# Patient Record
Sex: Male | Born: 1935 | Race: White | Marital: Married | Smoking: Never smoker
Health system: Northeastern US, Academic
[De-identification: ages and names within clinical notes are randomized; demographics above are authoritative.]

## PROBLEM LIST (undated history)

## (undated) DIAGNOSIS — I82409 Acute embolism and thrombosis of unspecified deep veins of unspecified lower extremity: Secondary | ICD-10-CM

## (undated) DIAGNOSIS — D6851 Activated protein C resistance: Secondary | ICD-10-CM

---

## 2011-11-18 DIAGNOSIS — A0472 Enterocolitis due to Clostridium difficile, not specified as recurrent: Secondary | ICD-10-CM

## 2011-11-18 HISTORY — DX: Enterocolitis due to Clostridium difficile, not specified as recurrent: A04.72

## 2012-05-08 ENCOUNTER — Encounter: Payer: Self-pay | Admitting: Family Medicine

## 2012-05-08 ENCOUNTER — Emergency Department
Admission: EM | Admit: 2012-05-08 | Disposition: A | Discharge status: Home / Self Care | Payer: Self-pay | Source: Ambulatory Visit | Attending: Emergency Medicine | Admitting: Emergency Medicine

## 2012-05-08 HISTORY — DX: Acute embolism and thrombosis of unspecified deep veins of unspecified lower extremity: I82.409

## 2012-05-08 HISTORY — DX: Activated protein C resistance: D68.51

## 2012-05-08 LAB — CBC AND DIFFERENTIAL
Baso # K/uL: 0 10*3/uL (ref 0.0–0.1)
Basophil %: 0.5 % (ref 0.2–1.2)
Eos # K/uL: 0.1 10*3/uL (ref 0.0–0.5)
Eosinophil %: 1.2 % (ref 0.8–7.0)
Hematocrit: 47 % (ref 40–51)
Hemoglobin: 15.8 g/dL (ref 13.7–17.5)
Lymph # K/uL: 2.1 10*3/uL (ref 1.3–3.6)
Lymphocyte %: 37.7 % (ref 21.8–53.1)
MCV: 94 fL — ABNORMAL HIGH (ref 79–92)
Mono # K/uL: 0.4 10*3/uL (ref 0.3–0.8)
Monocyte %: 7.3 % (ref 5.3–12.2)
Neut # K/uL: 3 10*3/uL (ref 1.8–5.4)
Platelets: 133 10*3/uL — ABNORMAL LOW (ref 150–330)
RBC: 5 MIL/uL (ref 4.6–6.1)
RDW: 12.6 % (ref 11.6–14.4)
Seg Neut %: 53.3 % (ref 34.0–67.9)
WBC: 5.7 10*3/uL (ref 4.2–9.1)

## 2012-05-08 LAB — BASIC METABOLIC PANEL
Anion Gap: 10 (ref 7–16)
CO2: 27 mmol/L (ref 20–28)
Calcium: 9.1 mg/dL (ref 8.6–10.2)
Chloride: 102 mmol/L (ref 96–108)
Creatinine: 0.76 mg/dL (ref 0.67–1.17)
GFR,Black: 103 *
GFR,Caucasian: 89 *
Glucose: 162 mg/dL — ABNORMAL HIGH (ref 60–99)
Lab: 13 mg/dL (ref 6–20)
Potassium: 4.1 mmol/L (ref 3.3–5.1)
Sodium: 139 mmol/L (ref 133–145)

## 2012-05-08 LAB — PROTIME-INR
INR: 1.1 (ref 1.0–1.2)
Protime: 10.8 s (ref 9.2–12.3)

## 2012-05-08 LAB — APTT: aPTT: 30 s (ref 25.8–37.9)

## 2012-05-08 MED ORDER — ENOXAPARIN SODIUM 120 MG/0.8ML IJ SOSY *I*
1.5000 mg/kg | PREFILLED_SYRINGE | Freq: Every day | INTRAMUSCULAR | Status: AC
Start: 2012-05-08 — End: 2012-05-15

## 2012-05-08 MED ORDER — ENOXAPARIN SODIUM 120 MG/0.8ML IJ SOSY *I*
1.5000 mg/kg | PREFILLED_SYRINGE | Freq: Once | INTRAMUSCULAR | Status: AC
Start: 2012-05-08 — End: 2012-05-08
  Administered 2012-05-08: 120 mg via SUBCUTANEOUS
  Filled 2012-05-08: qty 0.8

## 2012-05-08 MED ORDER — WARFARIN SODIUM 5 MG PO TABS *I*
5.0000 mg | ORAL_TABLET | Freq: Once | ORAL | Status: AC
Start: 2012-05-08 — End: 2012-05-08
  Administered 2012-05-08: 5 mg via ORAL
  Filled 2012-05-08: qty 1

## 2012-05-08 MED ORDER — WARFARIN SODIUM 5 MG PO TABS *I*
5.0000 mg | ORAL_TABLET | Freq: Every day | ORAL | Status: AC
Start: 2012-05-08 — End: 2012-05-15

## 2012-05-08 NOTE — ED Notes (Signed)
Pt c/o left leg pain since yesterday. Pt states he just drove @ 1200 miles from [place] and arrived yesterday. Has had a DVT in left leg in the past.

## 2012-05-08 NOTE — Discharge Instructions (Signed)
You have been diagnosed with a deep vein thrombosis.  You are being treated with anticoagulation.  Take lovenox injections daily for 7 days as instructed.  Take warfarin 5mg  daily until your INR is checked at the lab.  You have a lab requisition for for [facility] lab throught the [facility]. Get a lab draw in 4 days.    Some illnesses are best understood/treated over time.  May return day or night for significant worsening.  A call to primary care physician is recommended for any acute concerns.   Close follow up is recommended.

## 2012-05-08 NOTE — First Provider Contact (Signed)
ED Medical Screening Exam Note    Initial provider evaluation performed by   ED First Provider Contact    Date/Time Event User Comments    05/08/12 0920 ED Provider First Contact SASSANO, Furkan Keenum L Initial Face to Face Provider Contact          77 year-old male, history of factor V leiden and LLE DVT five years ago, presents with LLE pain, swelling, and redness since last night.  Just arrived home yesterday after a car trip from [place].  He is not currently anti-coagulated.  No chest pain or SOB, but note HR is 102.        LABS and U/S ordered.      Patient requires further evaluation.     Nicola Girt [place], [place], 05/08/2012, 9:33 AM

## 2012-05-08 NOTE — ED Provider Notes (Addendum)
History     Chief Complaint   Patient presents with   . Leg Pain     HPI Comments: Craig Frost is a 77 y.o. Male with pain and swelling over his left anterior tibia. Patient drove from [facility] yesterday, and began to have LLE pain last night. Aching, throbing. Pain now is throbbing, radiating down his leg, with associated burning sensation.  Denies chest pain, abd pain, SOB.        The history is provided by the patient.       Past Medical History   Diagnosis Date   . DVT (deep venous thrombosis)    . Factor V Leiden             Past Surgical History   Procedure Laterality Date   . Knee surgery         History reviewed. No pertinent family history.      Social History      reports that he has never smoked. He does not have any smokeless tobacco history on file. His alcohol, drug, and sexual activity histories not on file.    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   HENT: Negative for congestion.    Eyes: Negative for visual disturbance.   Respiratory: Negative for cough and shortness of breath.    Cardiovascular: Negative for chest pain.   Gastrointestinal: Negative for nausea and vomiting.   Musculoskeletal: Negative for back pain.   Skin: Negative for rash.   Neurological: Negative for syncope.   Psychiatric/Behavioral: Negative for confusion.       Physical Exam     ED Triage Vitals   BP Heart Rate Resp Temp Temp src SpO2 O2 Device O2 Flow Rate weight   05/08/12 0913 05/08/12 0913 05/08/12 0913 05/08/12 0913 -- 05/08/12 0913 05/08/12 0913 -- 05/08/12 0913   132/81 mmHg 102  18  36.2 C (97.2 F)  98 % None (Room air)  81.647 kg (180 lb)       Physical Exam   Nursing note and vitals reviewed.  Constitutional: He is oriented to person, place, and time. He appears well-developed.   HENT:    Head: Normocephalic and atraumatic.   Eyes: Conjunctivae and EOM are normal. Pupils are equal, round, and reactive to light.   Neck: Normal range of motion. Neck supple.   Cardiovascular: Regular rhythm, normal heart sounds and intact distal pulses.    Pulmonary/Chest: Effort normal and breath sounds normal. No respiratory distress. He has no wheezes. He has no rales. He exhibits no tenderness.   Abdominal: Soft. Bowel sounds are normal. There is no tenderness.   Musculoskeletal:        Left lower leg: He exhibits tenderness.        Legs:  TTP over medial proximal tibia, mild swelling and ecchymosis.     Lymphadenopathy:     He has no cervical adenopathy.   Neurological: He is alert and oriented to person, place, and time. No cranial nerve deficit.   Skin: Skin is warm and dry.   Psychiatric: He has a normal mood and affect.  Medical Decision Making      Amount and/or Complexity of Data Reviewed  Clinical lab tests: ordered        Initial Evaluation:  ED First Provider Contact    Date/Time Event User Comments    05/08/12 0920 ED Provider First Contact SASSANO, JENNIFER L Initial Face to Face Provider Contact          Patient seen by me as above    Assessment:  77 y.o., male comes to the ED with LLE pain,swelling, recent long car trip, Factor V leiden.  [place] c/w DVT in LLE.      Differential Diagnosis includes DVT, thrombophlebitis, low suspicion for PE.              Plan:   1. Diagnostics: cbc,ed7,inr,ptt  [place] LLE  2. Therapeutic: offer analgesia and antiemetics  3. Disposition: pending workup    +DVT  Lovenox, warfarin.  Will treat and discharge with prescriptions, and close follow up with PCP for INR.    Jaci Carrel, MD    Jaci Carrel, MD  Resident  05/08/12 1233              Resident Attestation:     Patient seen by me today, 05/08/2012 at 1145.    History:   I reviewed this patient, reviewed the resident's note and agree.    Exam:   I examined this patient, reviewed the resident's note and  agree.    Decision Making:   I discussed with the resident his/her documented decision making  and agree.      Author Jarrett Ables, MD      Jarrett Ables, MD  05/08/12 [phone]

## 2012-05-08 NOTE — ED Notes (Signed)
Introduced self to patient he is relaxing in bed awaiting test.

## 2012-05-08 NOTE — ED Notes (Signed)
Bed:AC-06L<BR> Expected date:<BR> Expected time:<BR> Means of arrival:<BR> Comments:<BR>

## 2012-05-08 NOTE — ED Notes (Addendum)
Pt cc left leg pain, swelling, redness onset yesterday after returning from [place], pt states " i drove back from [place]. " Pt has factor V leyden Pt denies cp/sob at this time. Will monitor pt.

## 2012-05-12 ENCOUNTER — Ambulatory Visit
Admit: 2012-05-12 | Discharge: 2012-05-12 | Disposition: A | Discharge status: Home / Self Care | Payer: Self-pay | Source: Ambulatory Visit | Attending: Gastroenterology | Admitting: Gastroenterology

## 2012-05-12 LAB — PROTIME-INR
INR: 1.3 — ABNORMAL HIGH (ref 1.0–1.2)
Protime: 13.4 s — ABNORMAL HIGH (ref 9.2–12.3)

## 2012-05-13 ENCOUNTER — Telehealth: Payer: Self-pay | Admitting: Internal Medicine

## 2012-05-13 NOTE — Telephone Encounter (Signed)
Patient is looking for a primary care physician, says he has MCR and BS as secondary.  Patient would like to get set up with Dr.Carroll.  Patient recently had a blood clot in his leg that he was treated for in [facility] ED.

## 2012-05-13 NOTE — Telephone Encounter (Signed)
Called patient, left vm for pt to call back and schedule appt.

## 2012-05-17 ENCOUNTER — Ambulatory Visit
Admit: 2012-05-17 | Discharge: 2012-05-17 | Disposition: A | Discharge status: Home / Self Care | Payer: Self-pay | Source: Ambulatory Visit | Attending: Gastroenterology | Admitting: Gastroenterology

## 2012-05-17 LAB — PROTIME-INR
INR: 4.4 — ABNORMAL HIGH (ref 1.0–1.2)
Protime: 48.6 s — ABNORMAL HIGH (ref 9.2–12.3)

## 2012-05-28 ENCOUNTER — Ambulatory Visit
Admit: 2012-05-28 | Discharge: 2012-05-28 | Disposition: A | Discharge status: Home / Self Care | Payer: Self-pay | Source: Ambulatory Visit | Attending: Gastroenterology | Admitting: Gastroenterology

## 2012-05-28 LAB — PROTIME-INR
INR: 3.8 — ABNORMAL HIGH (ref 1.0–1.2)
Protime: 42 s — ABNORMAL HIGH (ref 9.2–12.3)

## 2012-06-07 ENCOUNTER — Ambulatory Visit
Admit: 2012-06-07 | Discharge: 2012-06-07 | Disposition: A | Discharge status: Home / Self Care | Payer: Self-pay | Source: Ambulatory Visit

## 2012-06-07 LAB — PROTIME-INR
INR: 1.4 — ABNORMAL HIGH (ref 1.0–1.2)
Protime: 14.3 s — ABNORMAL HIGH (ref 9.2–12.3)

## 2012-06-23 ENCOUNTER — Telehealth: Payer: Self-pay | Admitting: Internal Medicine

## 2012-06-23 NOTE — Telephone Encounter (Signed)
Patient stated he has a NPV with Dr. Noralyn Pick on 8/16 and he has not received the forms in the mail yet. He would like these before his appointment so he doesn't have to fill them out at the office. Please send these to him at:  [address]   [place] [address]

## 2012-06-23 NOTE — Telephone Encounter (Signed)
His new patient packet was sent on Monday, 8/5.  I tried to reach the patient to tell him but no one answered and no voice mail.

## 2012-06-24 ENCOUNTER — Ambulatory Visit
Admit: 2012-06-24 | Discharge: 2012-06-24 | Disposition: A | Discharge status: Home / Self Care | Payer: Self-pay | Source: Ambulatory Visit

## 2012-06-24 LAB — PROTIME-INR
INR: 2.5 — ABNORMAL HIGH (ref 1.0–1.2)
Protime: 26.8 s — ABNORMAL HIGH (ref 9.2–12.3)

## 2012-07-02 ENCOUNTER — Ambulatory Visit: Payer: Self-pay | Admitting: Internal Medicine

## 2012-07-05 ENCOUNTER — Ambulatory Visit
Admit: 2012-07-05 | Discharge: 2012-07-05 | Disposition: A | Discharge status: Home / Self Care | Payer: Self-pay | Source: Ambulatory Visit

## 2012-07-05 LAB — PROTIME-INR
INR: 2.8 — ABNORMAL HIGH (ref 1.0–1.2)
Protime: 30 s — ABNORMAL HIGH (ref 9.2–12.3)

## 2012-08-09 ENCOUNTER — Ambulatory Visit
Admit: 2012-08-09 | Discharge: 2012-08-09 | Disposition: A | Discharge status: Home / Self Care | Payer: Self-pay | Source: Ambulatory Visit

## 2012-08-09 LAB — PROTIME-INR
INR: 3 — ABNORMAL HIGH (ref 1.0–1.2)
Protime: 32.6 s — ABNORMAL HIGH (ref 9.2–12.3)

## 2012-10-06 ENCOUNTER — Ambulatory Visit
Admit: 2012-10-06 | Discharge: 2012-10-06 | Disposition: A | Discharge status: Home / Self Care | Payer: Self-pay | Source: Ambulatory Visit

## 2012-10-06 LAB — PROTIME-INR
INR: 1.9 — ABNORMAL HIGH (ref 1.0–1.2)
Protime: 19.7 s — ABNORMAL HIGH (ref 9.2–12.3)

## 2014-07-05 ENCOUNTER — Ambulatory Visit
Admit: 2014-07-05 | Discharge: 2014-07-05 | Disposition: A | Discharge status: Home / Self Care | Payer: Self-pay | Source: Ambulatory Visit

## 2014-07-05 LAB — PROTIME-INR
INR: 1.1 (ref 1.0–1.2)
Protime: 12.8 s — ABNORMAL HIGH (ref 9.2–12.3)

## 2014-07-06 ENCOUNTER — Ambulatory Visit
Admit: 2014-07-06 | Discharge: 2014-07-06 | Disposition: A | Discharge status: Home / Self Care | Payer: Self-pay | Source: Ambulatory Visit

## 2014-07-06 LAB — PROTIME-INR
INR: 1.3 — ABNORMAL HIGH (ref 1.0–1.2)
Protime: 15.1 s — ABNORMAL HIGH (ref 9.2–12.3)

## 2014-07-10 ENCOUNTER — Ambulatory Visit
Admit: 2014-07-10 | Discharge: 2014-07-10 | Disposition: A | Discharge status: Home / Self Care | Payer: Self-pay | Source: Ambulatory Visit

## 2014-07-10 LAB — PROTIME-INR
INR: 2.6 — ABNORMAL HIGH (ref 1.0–1.2)
Protime: 29.7 s — ABNORMAL HIGH (ref 9.2–12.3)

## 2014-07-12 ENCOUNTER — Ambulatory Visit
Admit: 2014-07-12 | Discharge: 2014-07-12 | Disposition: A | Discharge status: Home / Self Care | Payer: Self-pay | Source: Ambulatory Visit

## 2014-07-12 LAB — PROTIME-INR
INR: 2.4 — ABNORMAL HIGH (ref 1.0–1.2)
Protime: 27.6 s — ABNORMAL HIGH (ref 9.2–12.3)

## 2014-07-17 ENCOUNTER — Ambulatory Visit
Admit: 2014-07-17 | Discharge: 2014-07-17 | Disposition: A | Discharge status: Home / Self Care | Payer: Self-pay | Source: Ambulatory Visit

## 2014-07-17 LAB — PROTIME-INR
INR: 2.4 — ABNORMAL HIGH (ref 1.0–1.2)
Protime: 27.4 s — ABNORMAL HIGH (ref 9.2–12.3)

## 2014-07-26 ENCOUNTER — Ambulatory Visit
Admit: 2014-07-26 | Discharge: 2014-07-26 | Disposition: A | Discharge status: Home / Self Care | Payer: Self-pay | Source: Ambulatory Visit

## 2014-07-26 LAB — PROTIME-INR
INR: 2.7 — ABNORMAL HIGH (ref 1.0–1.2)
Protime: 31.3 s — ABNORMAL HIGH (ref 9.2–12.3)

## 2016-07-02 ENCOUNTER — Other Ambulatory Visit
Admission: RE | Admit: 2016-07-02 | Discharge: 2016-07-02 | Disposition: A | Discharge status: Home / Self Care | Payer: Self-pay | Source: Ambulatory Visit

## 2016-07-02 LAB — BASIC METABOLIC PANEL
Anion Gap: 11 (ref 7–16)
CO2: 30 mmol/L — ABNORMAL HIGH (ref 20–28)
Calcium: 9.2 mg/dL (ref 8.6–10.2)
Chloride: 101 mmol/L (ref 96–108)
Creatinine: 0.94 mg/dL (ref 0.67–1.17)
GFR,Black: 89 *
GFR,Caucasian: 77 *
Glucose: 118 mg/dL — ABNORMAL HIGH (ref 60–99)
Lab: 14 mg/dL (ref 6–20)
Potassium: 4.3 mmol/L (ref 3.3–5.1)
Sodium: 142 mmol/L (ref 133–145)

## 2016-07-02 LAB — LIPID PANEL
Chol/HDL Ratio: 2
Cholesterol: 183 mg/dL
HDL: 92 mg/dL
LDL Calculated: 72 mg/dL
Non HDL Cholesterol: 91 mg/dL
Triglycerides: 96 mg/dL

## 2016-07-02 LAB — CBC
Hematocrit: 46 % (ref 40–51)
Hemoglobin: 15 g/dL (ref 13.7–17.5)
MCH: 31 pg/cell (ref 26–32)
MCHC: 33 g/dL (ref 32–37)
MCV: 95 fL — ABNORMAL HIGH (ref 79–92)
Platelets: 154 10*3/uL (ref 150–330)
RBC: 4.8 MIL/uL (ref 4.6–6.1)
RDW: 12.3 % (ref 11.6–14.4)
WBC: 5.8 10*3/uL (ref 4.2–9.1)

## 2016-07-02 LAB — TSH: TSH: 2.85 u[IU]/mL (ref 0.27–4.20)

## 2016-07-24 ENCOUNTER — Encounter: Payer: Self-pay | Admitting: Sports Medicine

## 2016-07-24 ENCOUNTER — Other Ambulatory Visit: Payer: Self-pay | Admitting: Sports Medicine

## 2016-07-24 ENCOUNTER — Ambulatory Visit: Payer: Self-pay | Admitting: Sports Medicine

## 2016-07-24 VITALS — BP 130/72 | Ht 72.0 in | Wt 190.0 lb

## 2016-07-24 DIAGNOSIS — M11262 Other chondrocalcinosis, left knee: Secondary | ICD-10-CM

## 2016-07-24 NOTE — Progress Notes (Signed)
C.C.: Presents for "knee pain"     HISTORY OF PRESENT ILLNESS    Craig Frost is a 81 y.o. male presents for evaluation and treatment of  left lower extremity pain. This is evaluated as a no injury. The symptoms began several weeks ago.  Mechanism of injury : none.  Patient has undergone duplex imaging of LLE which was unremarkable for DVT.  He is describing venous prominence with color changes that occur with sit to stand transfers and resolve with LLE elevation.  Has known Factor V leiden with prior history of DVT.    Current symptoms include swelling. The pain in his knee is located over his anteromedial facet and xray imaging was remarkable for OA, chondrocalcinosis.  He describes the symptoms as aching. Symptoms improve with rest, avoiding painful activities. The symptoms are worse with "activity". The knee has not given out or felt unstable. The patient can bend and straighten the knee fully.  Denies mechanical symptoms.   Patient reports history of prior problems with this knee with prior arthroscopy. No prior injections or PT.     Evaluation to date: plain films, duplex, which were documented and reviewed as below.     Listing & description of previous non surgical right knee treatment:  Activity modification: Y  Physical Therapy:N  Trial of medications:   Weight loss: N  Assisted ambulatory device: N  Intra-articular injection:N  Bracing, orthotic devices: N  Contraindications to above treatment:      Employment:Retired     Patient has previously been seen for this. Consult requested by Provider, Self Referred     Patient's medications, allergies, and surgical histories: Per patient questionnaire, reviewed and confirmed.  Past medical history: Per patient questionnaire, reviewed and confirmed.  Details include   Past Medical History:   Diagnosis Date    C. difficile colitis 2013    DVT (deep venous thrombosis)     Factor V Leiden        Social history: Per patient questionnaire, reviewed and  confirmed.   reports that she has never smoked. She does not have any smokeless tobacco history on file..  Family history: Per patient questionnaire, reviewed and confirmed.  No family history on file.  Past Surgical History:   Procedure Laterality Date    KNEE SURGERY           Evaluation to date: I personally reviewed patient's X-ray which shows chondrocalcinosis, mild/moderate knee OA.    Duplex LLE was unremarkable for DVT 07/18/16     A radiologist report is available for review.     ROS:  Review of systems:  Per patient questionnaire-"vein problems, clots".   Denies fever, chills, rash, night sweats, unintentional weight loss.  All other systems negative.         OBJECTIVE:   Vitals:    07/24/16 1345   BP: 130/72   Weight: 86.2 kg (190 lb)   Height: 1.829 m (6')       General: WDWN and no acute distress male.  Mental Status:  Alert and oriented x3,  cooperative with exam.  Extremities: prominent varicose veins L>R without LE edema; red color change noted upon standing   Gait:  Nonantalgic     Musculoskeletal:   KNEE: Trace palpable effusion or warmth. Tender to palpation: none.  Range of motion symmetric to opposite side. Neurovascularly intact.  crepitus.  Stable ligamentous testing.      Anterior Drawer: negative  Posterior Drawer: negative  Lachman's: negative  Valgus  stress: stable   Varus stress: stable  McMurray's: negative     ASSESSMENT / PLAN:  Impression:   81 y.o. M with Factor V leiden with LLE pain  that does not appear to be secondary to MSK/knee OA/chondrocalcinosis at the present time.      Plan: 1. Chondrocalcinosis of knee, left  Counseled in great length on chondrocalcinosis and OA presentation which are not c/w patient's current symptoms.  I advised follow up with vascular for further evaluation.      Questions solicited and answered.     Precautions reviewed.  Medication changes, refills reviewed including directions, side effects and monitoring.    Hoyle BarrMaria Karipidis Pouria, MD, FAAP,  CAQ  Assistant professor, Department of Orthopaedics    Primary Care-Internal Medicine/Pediatrics Sports Medicine & Orthopaedics  Non-operative Osteoarthritis/Adult Reconstruction     Faculty, [facility]    Team Physician, [facility]     [address], [address]   [place], [place] [id number]  Office Phone: [phone]  Email: [email or web address]

## 2020-05-22 ENCOUNTER — Other Ambulatory Visit: Payer: Self-pay | Admitting: Gastroenterology

## 2020-06-19 ENCOUNTER — Other Ambulatory Visit
Admission: RE | Admit: 2020-06-19 | Discharge: 2020-06-19 | Disposition: A | Discharge status: Home / Self Care | Payer: Medicare Other | Source: Ambulatory Visit

## 2020-06-19 DIAGNOSIS — Z7901 Long term (current) use of anticoagulants: Secondary | ICD-10-CM | POA: Insufficient documentation

## 2020-06-19 LAB — APTT: aPTT: 42.5 s — ABNORMAL HIGH (ref 25.8–37.9)

## 2020-06-19 LAB — PROTIME-INR
INR: 2.8 — ABNORMAL HIGH (ref 0.9–1.1)
Protime: 33.1 s — ABNORMAL HIGH (ref 10.0–12.9)

## 2020-07-27 ENCOUNTER — Other Ambulatory Visit
Admission: RE | Admit: 2020-07-27 | Discharge: 2020-07-27 | Disposition: A | Discharge status: Home / Self Care | Payer: Medicare Other | Source: Ambulatory Visit

## 2020-07-27 DIAGNOSIS — Z7901 Long term (current) use of anticoagulants: Secondary | ICD-10-CM | POA: Insufficient documentation

## 2020-07-27 LAB — PROTIME-INR
INR: 3.4 — ABNORMAL HIGH (ref 0.9–1.1)
Protime: 39.9 s — ABNORMAL HIGH (ref 10.0–12.9)

## 2020-08-15 ENCOUNTER — Other Ambulatory Visit
Admission: RE | Admit: 2020-08-15 | Discharge: 2020-08-15 | Disposition: A | Discharge status: Home / Self Care | Payer: Medicare Other | Source: Ambulatory Visit

## 2020-08-15 DIAGNOSIS — Z7901 Long term (current) use of anticoagulants: Secondary | ICD-10-CM | POA: Insufficient documentation

## 2020-08-15 LAB — APTT: aPTT: 35.6 s (ref 25.8–37.9)

## 2020-08-15 LAB — PROTIME-INR
INR: 1.7 — ABNORMAL HIGH (ref 0.9–1.1)
Protime: 19.7 s — ABNORMAL HIGH (ref 10.0–12.9)

## 2020-08-21 ENCOUNTER — Other Ambulatory Visit
Admission: RE | Admit: 2020-08-21 | Discharge: 2020-08-21 | Disposition: A | Discharge status: Home / Self Care | Payer: Medicare Other | Source: Ambulatory Visit

## 2020-08-21 DIAGNOSIS — Z7901 Long term (current) use of anticoagulants: Secondary | ICD-10-CM | POA: Insufficient documentation

## 2020-08-21 LAB — APTT: aPTT: 42.6 s — ABNORMAL HIGH (ref 25.8–37.9)

## 2020-08-21 LAB — PROTIME-INR
INR: 2.2 — ABNORMAL HIGH (ref 0.9–1.1)
Protime: 25.3 s — ABNORMAL HIGH (ref 10.0–12.9)

## 2020-11-23 IMAGING — MR MULTI PARAMETRIC MRI PROSTATE WITHOUT AND WITH CONTRAST
11 series · 48 of 48 positions shown · IV contrast (gadolinium)
Comparison: None.

MULTI PARAMETRIC MRI PROSTATE WITHOUT AND WITH CONTRAST, 11/23/2020 [DATE]: 
CLINICAL INDICATION:  Elevated PSA.
TECHNIQUE: Multiple parametric sequences were performed. 
Pre-contrast: T1 axial of the entire pelvis. T2 sagittal, axial and coronal, T1 
axial, acquired of the prostate. Diffusion with multiple B values of 0888, 1188 
calculated ADC value for mapping. 
Post contrast: Rapid sequence dynamic and axial planes through the prostate and 
seminal vesicles, T1 axial with fat sat of the entire pelvis. 3-D renderings 
were reconstructed on an independent workstation. The images were also evaluated 
with Dyna CAD computer aided detection.  A 0.5 cc gadolinium was injected 
intravenously at 3 cc/s.  As per [HOSPITAL] guidelines 3-D 
reconstructions are performed with concurrent physician supervision. The 
patient's eGFR was calculated to be 92.3 using the i-STAT device.

[Series 201: t1w_tse_ax · axial · 6.0mm · 0.44mm/px · 1 of 36 slices shown]
[im 1/36]
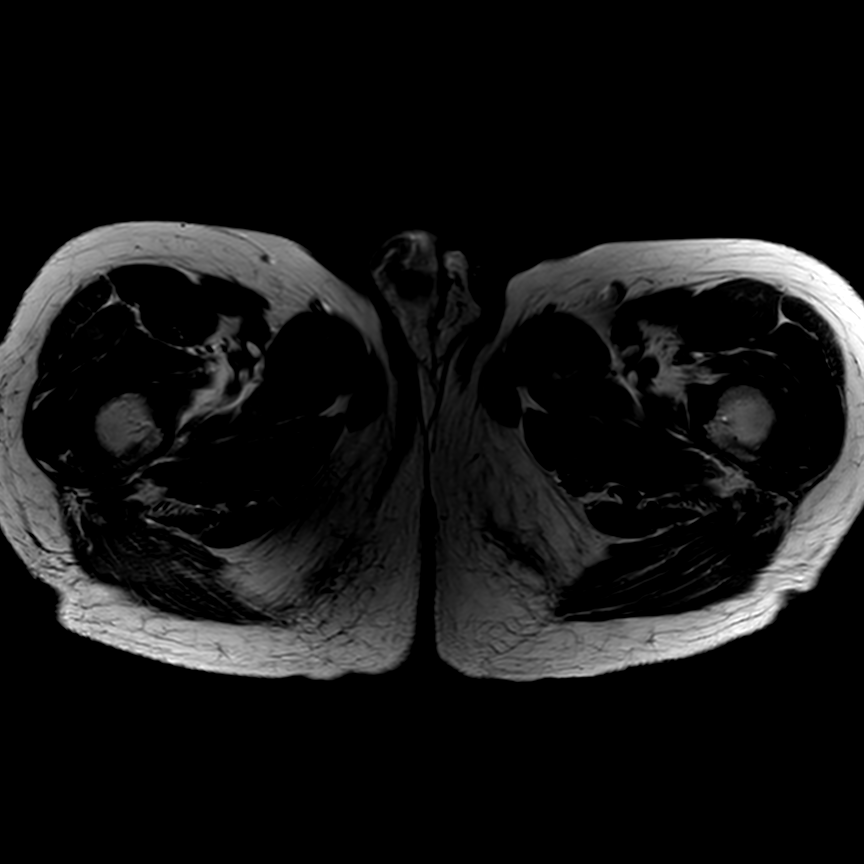

[Series 301: t2w sag · sagittal · 3.0mm · 0.45mm/px · 1 of 30 slices shown]
[im 1/30]
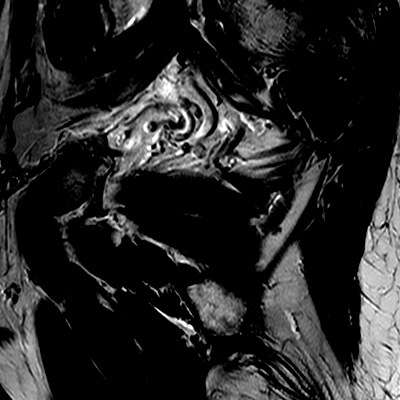

[Series 401: t2w cor · coronal · 3.0mm · 0.42mm/px · 1 of 30 slices shown]
[im 1/30]
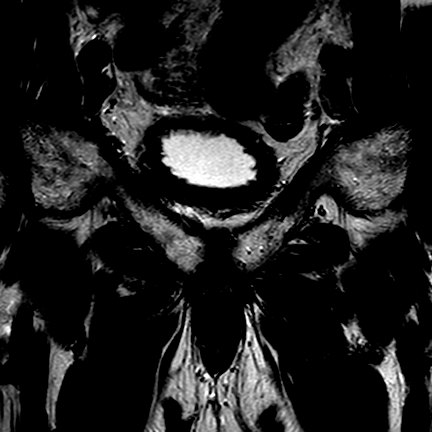

[Series 601: new-dwi_3b* 3mm* · axial · 3.0mm · 1.28mm/px · 1 of 61 slices shown]
[im 1/61]
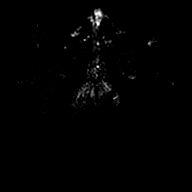

[Series 602: ADC · axial · 3.0mm · 1.28mm/px · 1 of 32 slices shown (1 of 2)]
[im 1/32]
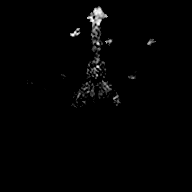

[Series 603: ADC · axial · 3.0mm · 1.28mm/px · 1 of 32 slices shown (2 of 2)]
[im 1/32]
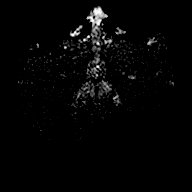

[Series 604: (id) · axial · 3.0mm · 1.28mm/px · 1 of 29 slices shown (1 of 3)]
[im 1/29]
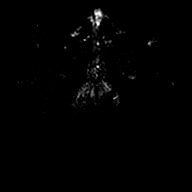

[Series 605: (id) · axial · 3.0mm · 1.28mm/px · 1 of 32 slices shown (2 of 3)]
[im 1/32]
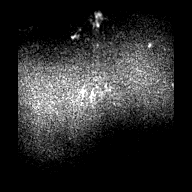

[Series 701: (id) · axial · 3.0mm · 1.28mm/px · 1 of 32 slices shown (3 of 3)]
[im 1/32]
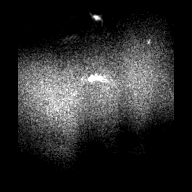

[Series 901: dyn 3mm*(ap) · axial · 3.0mm · 1.38mm/px · z∈[-154,-61]mm · 38 of 1440 slices shown]
[im 1/1440]
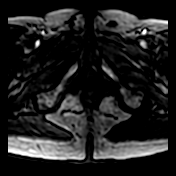
[im 39/1440]
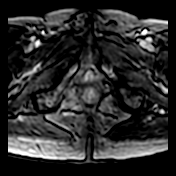
[im 78/1440]
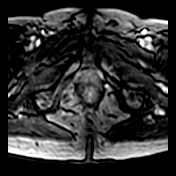
[im 117/1440]
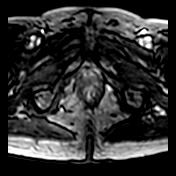
[im 156/1440]
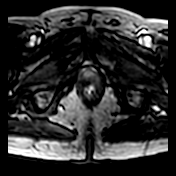
[im 195/1440]
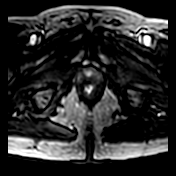
[im 234/1440]
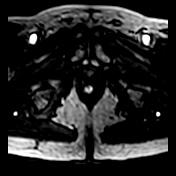
[im 273/1440]
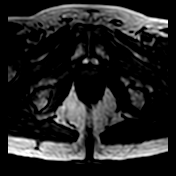
[im 312/1440]
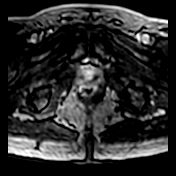
[im 351/1440]
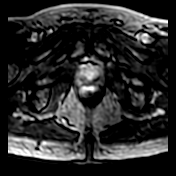
[im 389/1440]
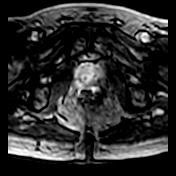
[im 428/1440]
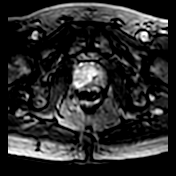
[im 467/1440]
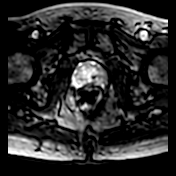
[im 506/1440]
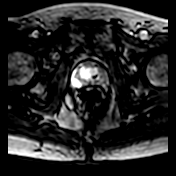
[im 545/1440]
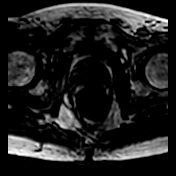
[im 584/1440]
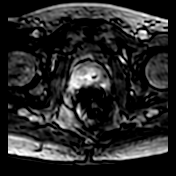
[im 623/1440]
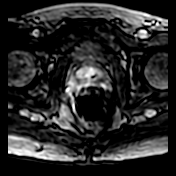
[im 662/1440]
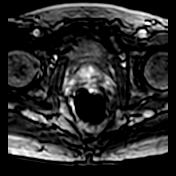
[im 701/1440]
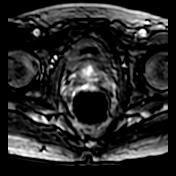
[im 739/1440]
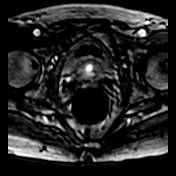
[im 778/1440]
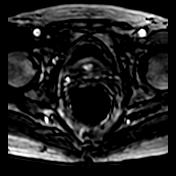
[im 817/1440]
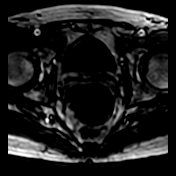
[im 856/1440]
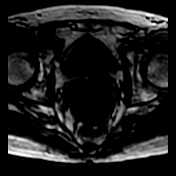
[im 895/1440]
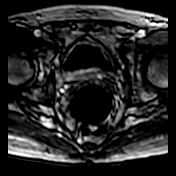
[im 934/1440]
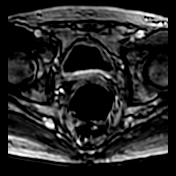
[im 973/1440]
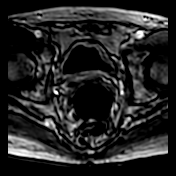
[im 1012/1440]
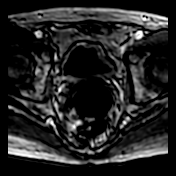
[im 1051/1440]
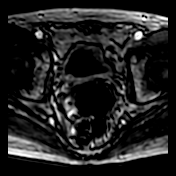
[im 1089/1440]
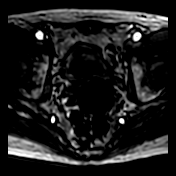
[im 1128/1440]
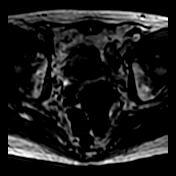
[im 1167/1440]
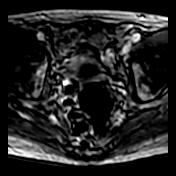
[im 1206/1440]
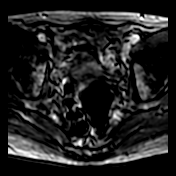
[im 1245/1440]
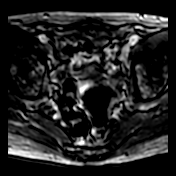
[im 1284/1440]
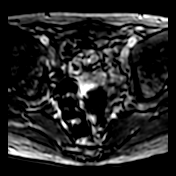
[im 1323/1440]
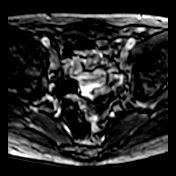
[im 1362/1440]
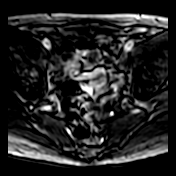
[im 1401/1440]
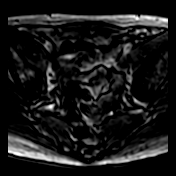
[im 1440/1440]
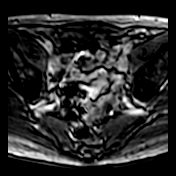

[Series 1001: t1w/sp/pel+g · axial · 6.5mm · 0.63mm/px · 1 of 42 slices shown]
[im 1/42]
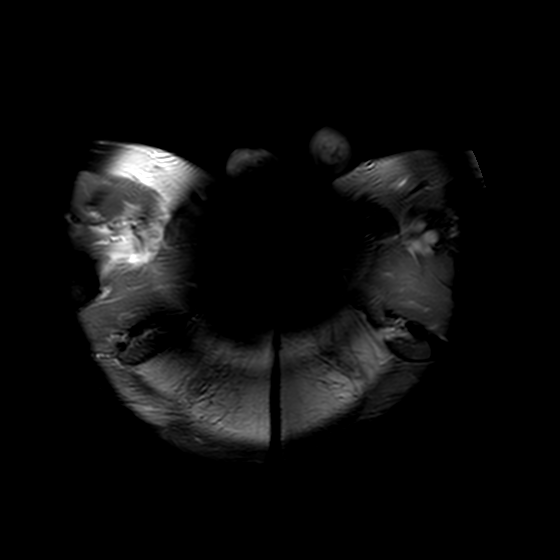

[48 of 48 positions shown; findings below may reference images not displayed]

FINDINGS: VOLUME: The prostate measures 5.1 x 3.3 x 3.4 cm with a volume of 29 cc. 
CENTRAL GLAND: There is mild hyperplasia of the central gland without suspicious 
findings. 
PERIPHERAL ZONE: There is effacement of the RIGHT posterolateral peripheral zone 
from mid gland to base by T2 hypointensity measuring upwards of 2 cm in 
dimension. This area shows kinetically suspicious asymmetric enhancement. The 
diffusion sequences are nondiagnostic due to artifact. 
PROSTATE CAPSULE: Intact and symmetric. 
MUSCLE SIDE WALLS: Normal. 
SEMINAL VESICLES: Normal. 
BLADDER: Unremarkable. 
LYMPHADENOPATHY: No suspicious lymph nodes. 
BONES: No suspicious skeletal lesion 
ADDITIONAL FINDINGS: Degenerative changes are in the lower lumbar spine.
IMPRESSION: Findings on T2 sequences are suspicious for a 2 cm focus of malignancy in the 
RIGHT posterolateral peripheral zone at mid gland extending to the base and 
there is associated asymmetric kinetically suspicious enhancement however 
because the diffusion sequences are nondiagnostic it is unknown whether there is 
restricted diffusion associated with this area or not. 
(PI-RADS 3): Intermediate ( the presence of clinically significant cancer is 
equivocal).

## 2021-04-26 IMAGING — CT CT ABDOMEN AND PELVIS WITHOUT CONTRAST
2 of 3 series · 15 of 46 positions shown, 17 images · non-contrast
Comparison: MR prostate 11/23/2020

FINAL Diagnostic Imaging Report 
________________________________________________________________________________________________ 
CT ABDOMEN AND PELVIS WITHOUT CONTRAST, 04/26/2021 [DATE]: 
CLINICAL INDICATION: Recurrent UTI, currently on antibiotics 
A search for DICOM formatted images was conducted for prior CT imaging studies 
completed at a non-affiliated media free facility.
TECHNIQUE: The abdomen and pelvis were scanned from lung bases through the 
pubic rami without contrast on a high-resolution CT scanner using dose reduction 
techniques. Routine MPR reconstructions were performed.

[Series 2: abd/pel ax wo · axial · 0.96mm/px · z∈[-539,-83]mm · 12 of 176 slices shown, 14 images]
[im 12/176  soft-tissue]
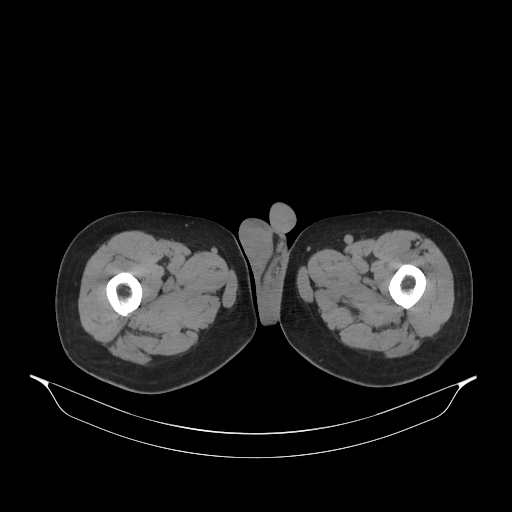
[im 12/176  bone]
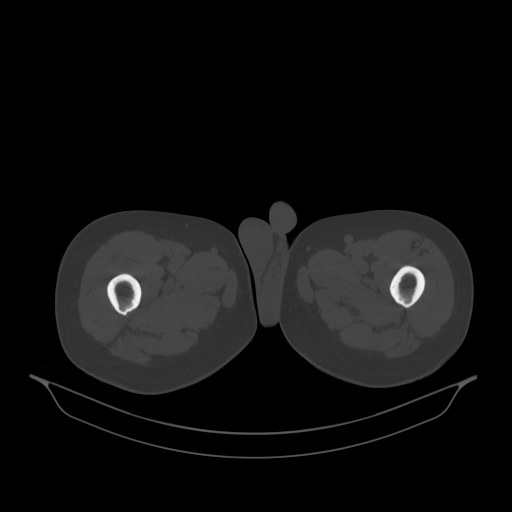
[im 23/176  soft-tissue]
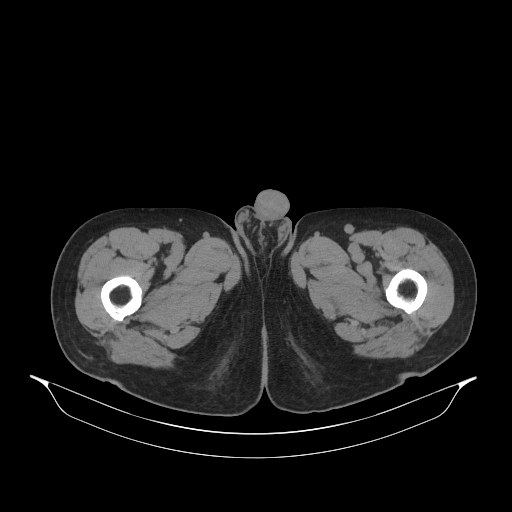
[im 40/176  soft-tissue]
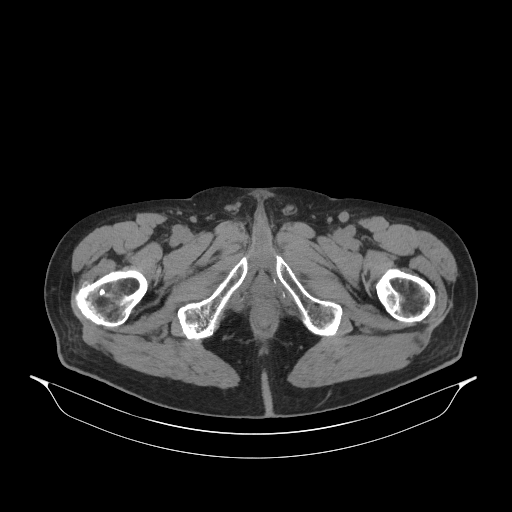
[im 51/176  soft-tissue]
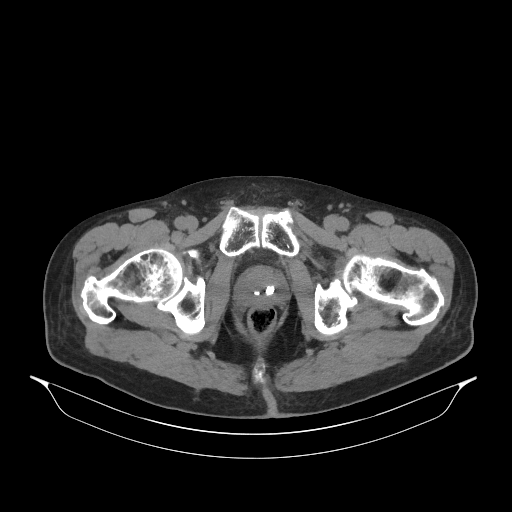
[im 68/176  soft-tissue]
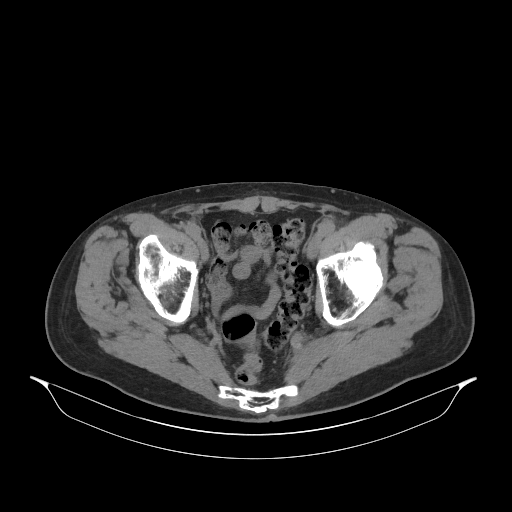
[im 80/176  soft-tissue]
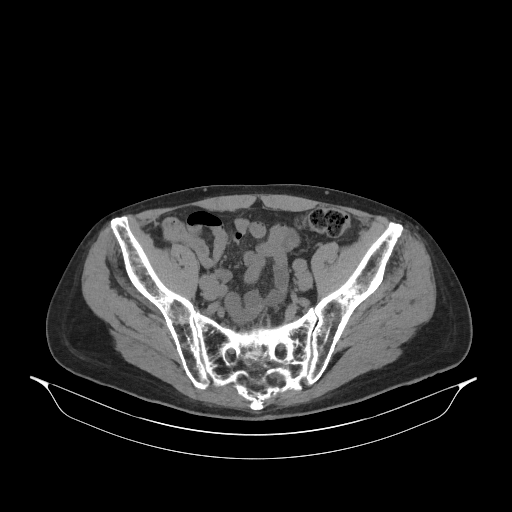
[im 96/176  soft-tissue]
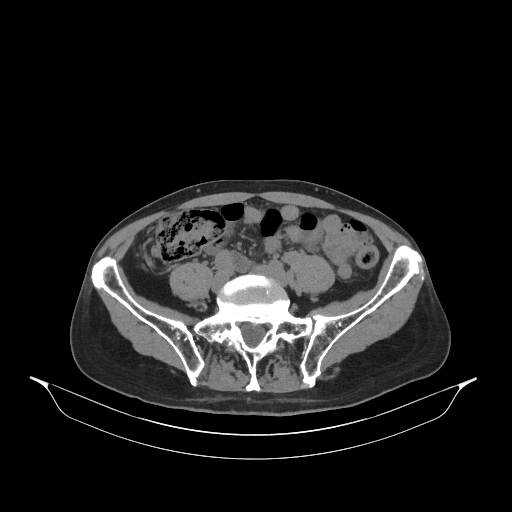
[im 108/176  soft-tissue]
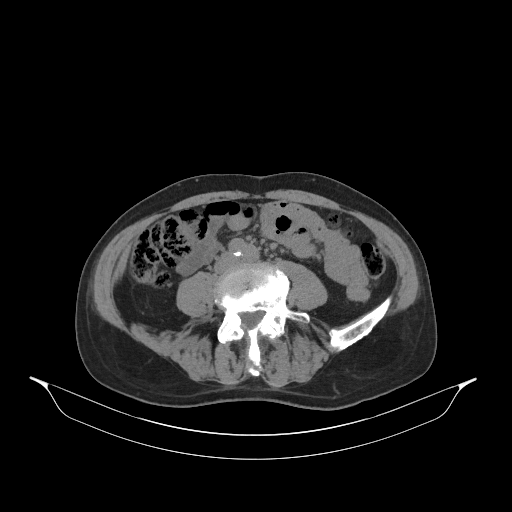
[im 125/176  soft-tissue]
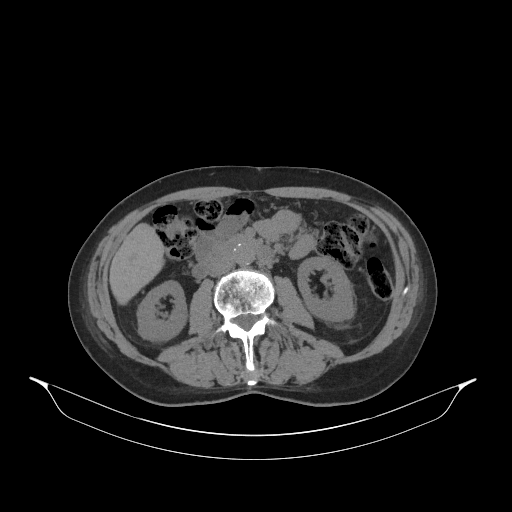
[im 125/176  bone]
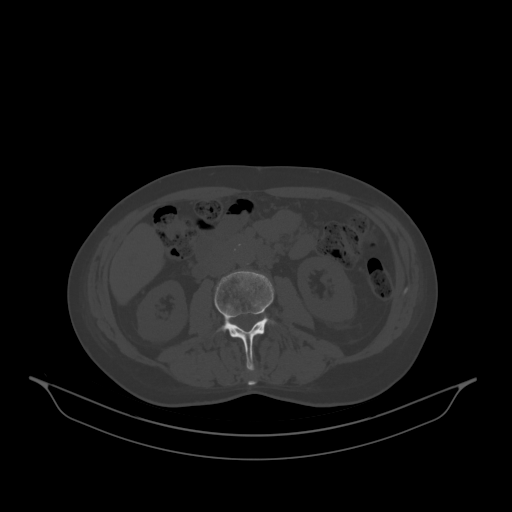
[im 136/176  soft-tissue]
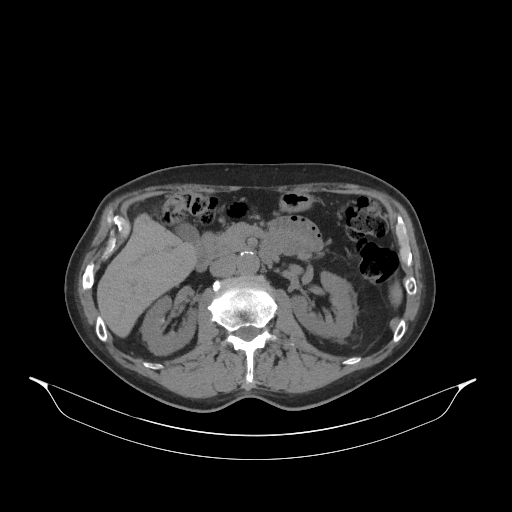
[im 153/176  soft-tissue]
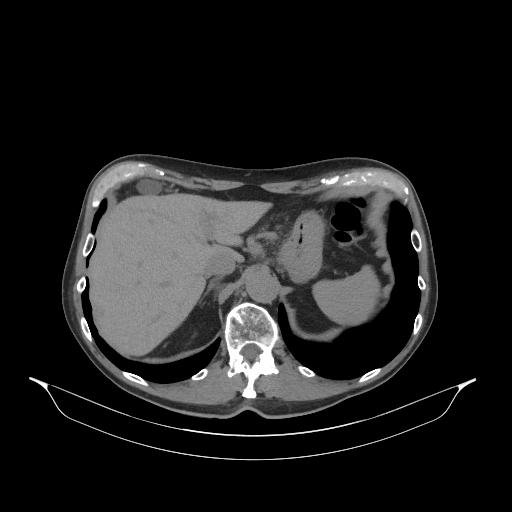
[im 164/176  soft-tissue]
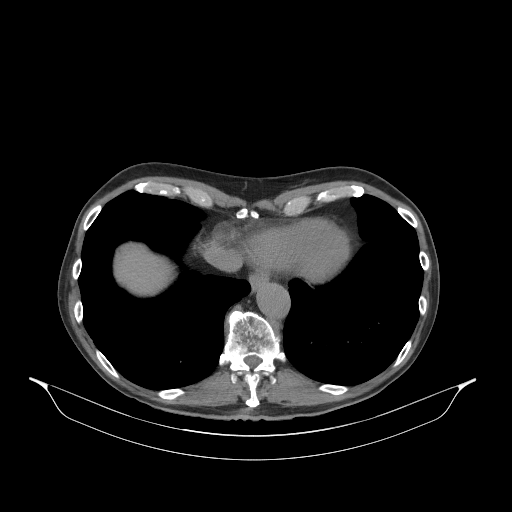

[Series 3: abd/pel cor wo · coronal · 0.85mm/px · 3 of 154 slices shown]
[im 52/154  soft-tissue]
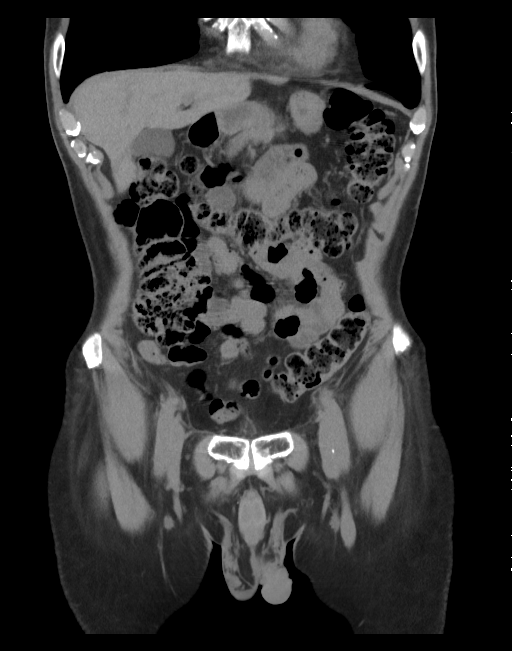
[im 69/154  soft-tissue]
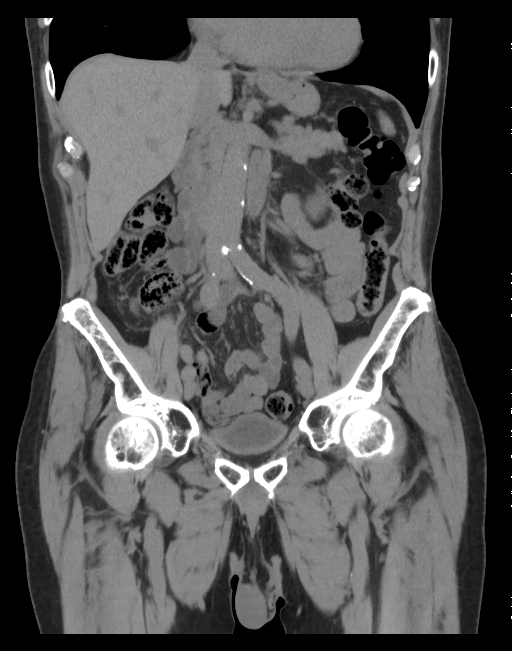
[im 86/154  soft-tissue]
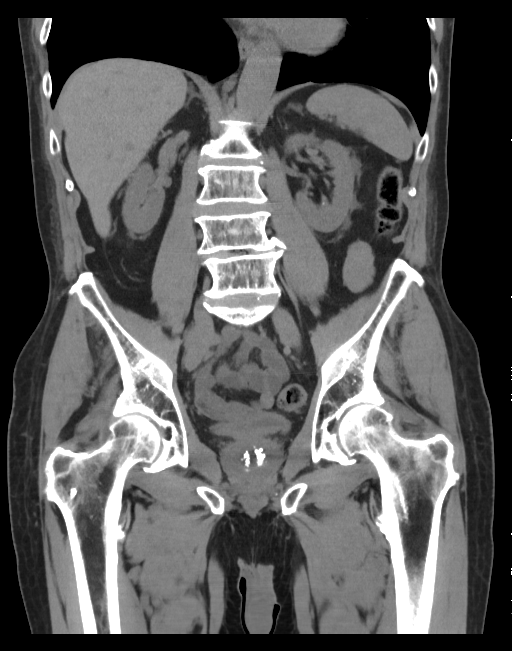

[15 of 46 positions shown; findings below may reference images not displayed]

FINDINGS: LUNG BASES: Calcified granuloma RIGHT lateral lung base. Otherwise clear. Mild 
pectus excavatum deformity. Normal heart size. 
HEPATOBILIARY: Normal noncontrast. Moderate gallbladder distention. No stones or 
biliary ductal dilatation.  
SPLEEN: Normal. 
PANCREAS: Normal.   
ADRENALS: Mild LEFT adrenal hyperplasia. 
GENITOURINARY: Mildly hyperdense pyramids. 1 to 2 mm bilateral calyceal 
nonobstructive stones. Mild nonspecific perinephric stranding. No 
hydronephrosis. Incompletely distended urinary bladder with mild wall thickening 
may be artifactual due to the under distention. Prostate measures 5.3 cm 
transversely. Central calcifications. Symmetric seminal vesicles. 
 LYMPH NODES: No adenopathy. 
STOMACH, SMALL BOWEL AND COLON: Decompressed stomach. Unremarkable small bowel. 
Diffuse fecal retention. 
VASCULAR STRUCTURES: No aneurysm.  
MUSCULOSKELETAL: Global osteopenia. Coarsened pelvic trabecula. Hypertrophic 
spinous processes.  
ADDITIONAL FINDINGS: No free air or free fluid..
IMPRESSION: Bilateral mild nephrolithiasis, largest stone 1 mm. 
Incompletely distended urinary bladder with mild wall thickening may be 
artifactual. Correlate with urinalysis. 
Coarsened trabecula in the bony pelvis/S1 segment versus osteopenia with 
relative prominence of the trabecula. Consider Pagets. Recommend correlation 
with alkaline phosphatase. 
Mildly enlarged prostate. Intermediate probability for prostate malignancy on 
recent MR prostate. Correlate with follow-up PSA. 
RADIATION DOSE REDUCTION: All CT scans are performed using radiation dose 
reduction techniques, when applicable.  Technical factors are evaluated and 
adjusted to ensure appropriate moderation of exposure.  Automated dose 
management technology is applied to adjust the radiation doses to minimize 
exposure while achieving diagnostic quality images.

## 2022-02-05 IMAGING — MR MULTI PARAMETRIC MRI PROSTATE W/O AND W
14 series · 48 of 48 positions shown · IV contrast (gadavist)
Comparison: November 2020

________________________________________________________________________________________________ 
MULTI PARAMETRIC MRI PROSTATE W/O AND W, 02/05/2022 [DATE]: 
CLINICAL INDICATION: Elevated PSA. No prior biopsy.
TECHNIQUE: Multiple parametric sequences were performed. Patient was scanned on 
a 3T magnet. Pre-contrast: T1 axial of the entire pelvis. T2 sagittal, axial and 
coronal, T1 axial, acquired of the prostate. Diffusion with multiple B values of 
0444, 6477 calculated ADC value for mapping. Post contrast: Rapid sequence 
dynamic and axial planes through the prostate and seminal vesicles, T1 axial 
with fat sat of the entire pelvis. 3-D renderings were reconstructed on an 
independent workstation. The images were also evaluated with Dyna CAD computer 
aided detection. 8.5 of Gadavist were injected intravenously. 1.5 mL of Gadavist 
were discarded. As per [HOSPITAL] guidelines 3-D 
reconstructions are performed with concurrent physician supervision.

[Series 101: survey-mst · axial · 10.0mm · 1.34mm/px · 1 of 14 slices shown]
[im 1/14]
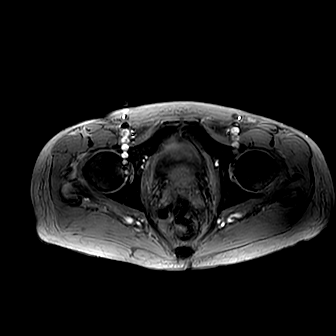

[Series 201: t1w_tse_ax · axial · 6.0mm · 0.67mm/px · 1 of 36 slices shown]
[im 1/36]
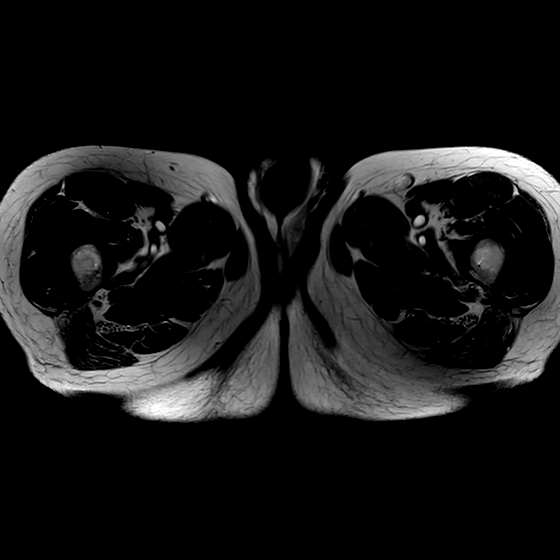

[Series 301: t2w sag · sagittal · 3.0mm · 0.45mm/px · 1 of 30 slices shown]
[im 1/30]
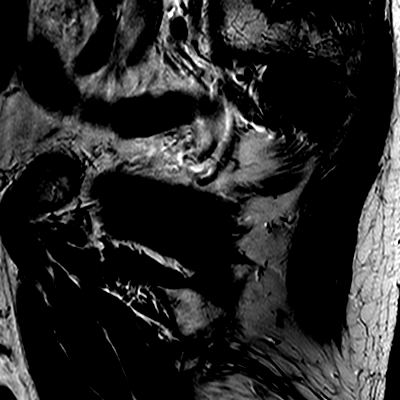

[Series 401: t2w cor · coronal · 3.0mm · 0.42mm/px · 1 of 30 slices shown]
[im 1/30]
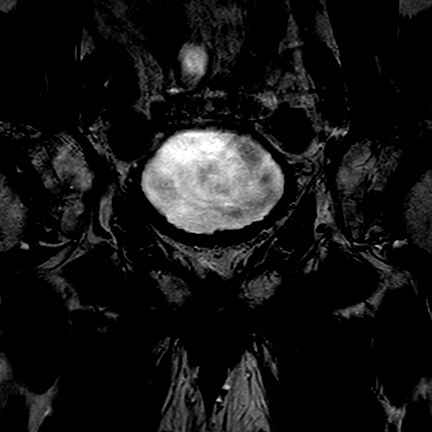

[Series 501: t2w ax · axial · 3.0mm · 0.27mm/px · 1 of 32 slices shown (1 of 2)]
[im 1/32]
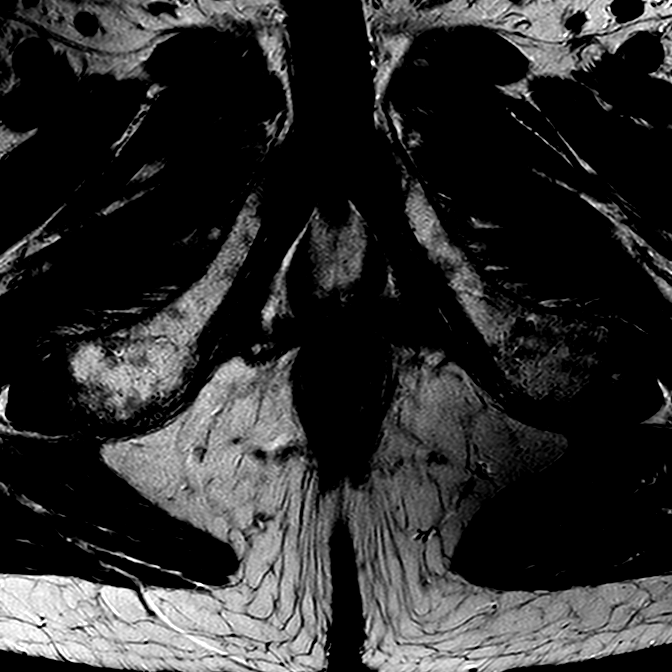

[Series 601: new-dwi_3b* 3mm* · axial · 3.0mm · 1.28mm/px · 1 of 64 slices shown]
[im 1/64]
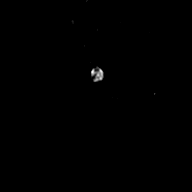

[Series 602: ADC · axial · 3.0mm · 1.28mm/px · 1 of 32 slices shown (1 of 2)]
[im 1/32]
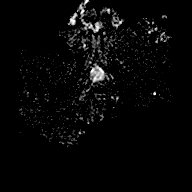

[Series 603: ADC · axial · 3.0mm · 1.28mm/px · 1 of 32 slices shown (2 of 2)]
[im 1/32]
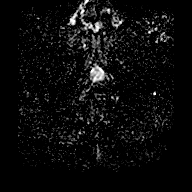

[Series 604: (id) · axial · 3.0mm · 1.28mm/px · 1 of 32 slices shown (1 of 3)]
[im 1/32]
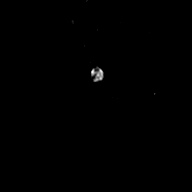

[Series 605: (id) · axial · 3.0mm · 1.28mm/px · 1 of 32 slices shown (2 of 3)]
[im 1/32]
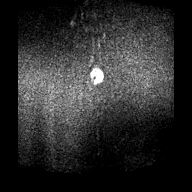

[Series 701: (id) · axial · 3.0mm · 1.28mm/px · 1 of 32 slices shown (3 of 3)]
[im 1/32]
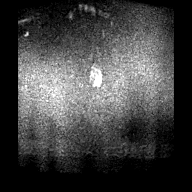

[Series 901: dyn 3mm*(ap) · axial · 3.0mm · 1.38mm/px · z∈[-69,+24]mm · 35 of 1440 slices shown]
[im 1/1440]
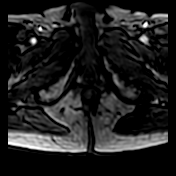
[im 43/1440]
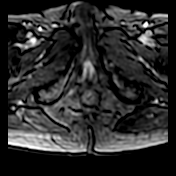
[im 85/1440]
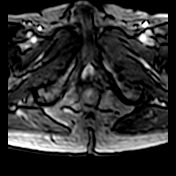
[im 127/1440]
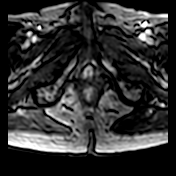
[im 170/1440]
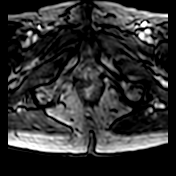
[im 212/1440]
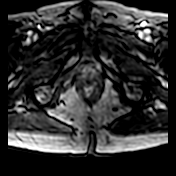
[im 254/1440]
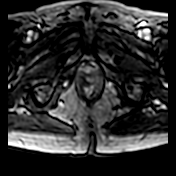
[im 297/1440]
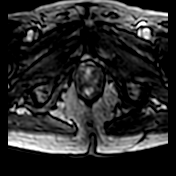
[im 339/1440]
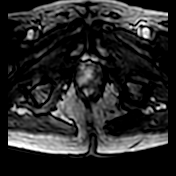
[im 381/1440]
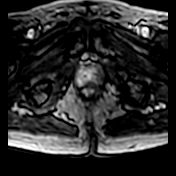
[im 424/1440]
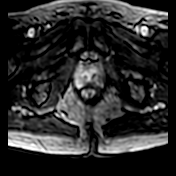
[im 466/1440]
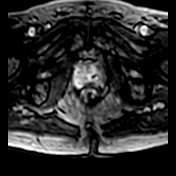
[im 508/1440]
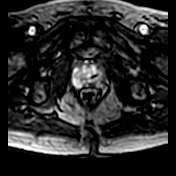
[im 551/1440]
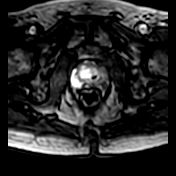
[im 593/1440]
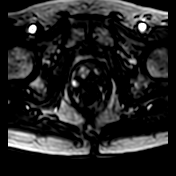
[im 635/1440]
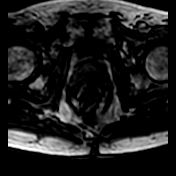
[im 678/1440]
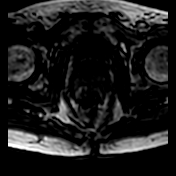
[im 720/1440]
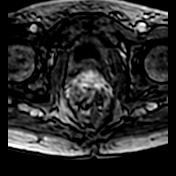
[im 762/1440]
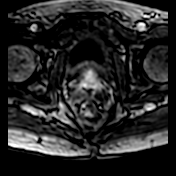
[im 805/1440]
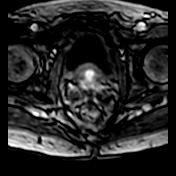
[im 847/1440]
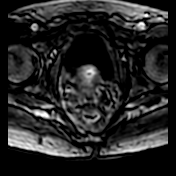
[im 889/1440]
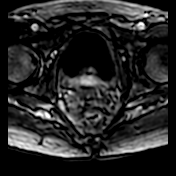
[im 932/1440]
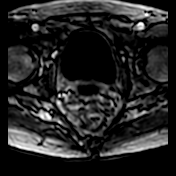
[im 974/1440]
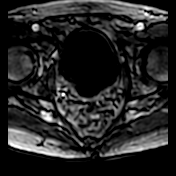
[im 1016/1440]
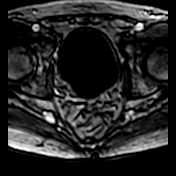
[im 1059/1440]
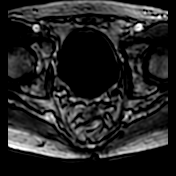
[im 1101/1440]
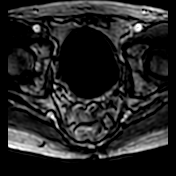
[im 1143/1440]
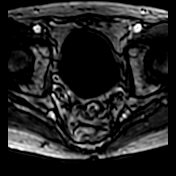
[im 1186/1440]
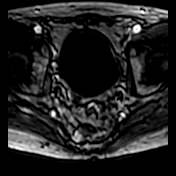
[im 1228/1440]
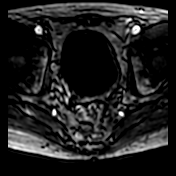
[im 1270/1440]
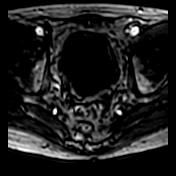
[im 1313/1440]
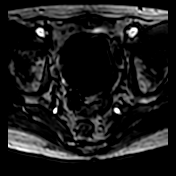
[im 1355/1440]
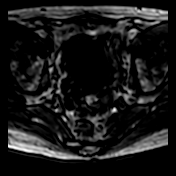
[im 1397/1440]
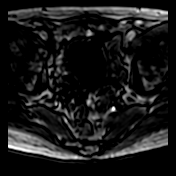
[im 1440/1440]
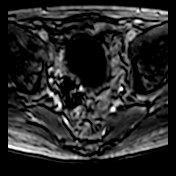

[Series 1002: DIXON · axial · 6.0mm · 0.51mm/px · 1 of 36 slices shown]
[im 1/36]
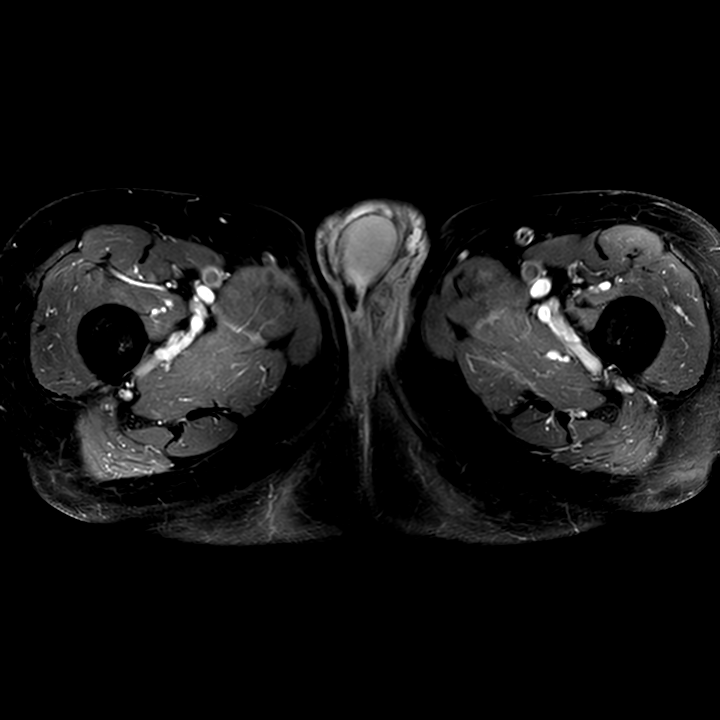

[Series 1101: t2w ax · axial · 3.0mm · 0.27mm/px · 1 of 32 slices shown (2 of 2)]
[im 1/32]
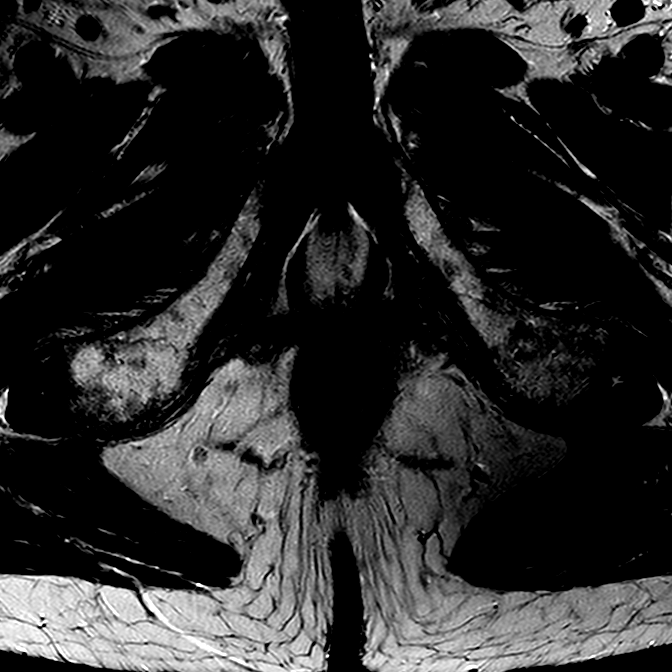

[48 of 48 positions shown; findings below may reference images not displayed]

FINDINGS: VOLUME: 28 cc 
CENTRAL GLAND: Nodular in appearance. No areas of restricted diffusion. 
PERIPHERAL ZONE: Confluent area of decreased T2 signal again identified on the 
right occupying the majority of the right peripheral zone measuring up to 2.1 cm 
within the mid gland to base level. This is decreased in signal on ADC and 
mildly increased on diffusion concerning for malignancy. For the most part 
similar to the exam from November 23, 2020. 
PROSTATE CAPSULE: Indistinct along the posterior lateral aspect of the right 
peripheral zone. Some of this could be secondary to motion versus tumor 
extension through the capsule. 
MUSCLE SIDE WALLS: Normal in appearance. 
SEMINAL VESICLES: Normal in appearance. 
BLADDER: Trabeculated. 
LYMPHADENOPATHY: None 
BONES: No osseous lesions identified. 
ADDITIONAL FINDINGS: Degenerative changes lower lumbar spine.
IMPRESSION: Findings concerning for malignancy right peripheral zone with question of 
extracapsular extension. 
(PI-RADS 5): Very high (clinically significant cancer is highly likely to be 
present).

## 2023-06-16 ENCOUNTER — Other Ambulatory Visit
Admission: RE | Admit: 2023-06-16 | Discharge: 2023-06-16 | Disposition: A | Discharge status: Home / Self Care | Payer: Medicare Other | Source: Ambulatory Visit

## 2023-06-16 DIAGNOSIS — D6869 Other thrombophilia: Secondary | ICD-10-CM | POA: Insufficient documentation

## 2023-06-16 LAB — PROTIME-INR
INR: 2.9 — ABNORMAL HIGH (ref 0.9–1.1)
Protime: 33.3 s — ABNORMAL HIGH (ref 10.0–12.9)

## 2023-06-16 LAB — PSA, TOTAL AND FREE
PSA (eff. 4-2010): 13.7 ng/mL — ABNORMAL HIGH (ref 0.00–4.00)
PSA,FREE (eff. 4-2010): 1.25 ng/mL
PSA,Free %: 9 %

## 2023-07-30 ENCOUNTER — Other Ambulatory Visit
Admission: RE | Admit: 2023-07-30 | Discharge: 2023-07-30 | Disposition: A | Discharge status: Home / Self Care | Payer: Medicare Other | Source: Ambulatory Visit

## 2023-07-30 DIAGNOSIS — D6869 Other thrombophilia: Secondary | ICD-10-CM | POA: Insufficient documentation

## 2023-07-30 LAB — PROTIME-INR
INR: 2.7 — ABNORMAL HIGH (ref 0.9–1.1)
Protime: 30.5 s — ABNORMAL HIGH (ref 10.0–12.9)

## 2023-10-06 IMAGING — MR MULTI PARAMETRIC MRI PROSTATE W/O AND W
13 series · 48 of 48 positions shown · IV contrast (gadavist)
Comparison: January 2022

________________________________________________________________________________________________ 
MULTI PARAMETRIC MRI PROSTATE W/O AND W, 10/06/2023 [DATE]: 
CLINICAL INDICATION: Elevated Prostate Specific Antigen [psa]
TECHNIQUE: Multiple parametric sequences were performed Pre-contrast: T1 axial 
of the entire pelvis. T2 sagittal, axial  and coronal,T1 axial, acquired of the 
prostate. Diffusion with multiple B values of 0111, 3199 calculated ADC value 
for mapping. Post contrast: Rapid sequence dynamic and axial planes through the 
prostate and seminal vesicles,T1 axial with fat sat of the entire pelvis. 3-D 
renderings were reconstructed on an independent workstation. The images were 
also evaluated with Dyna CAD computer aided detection. 8.5 mL of Gadavist were 
injected intravenously. 1.5 mL of Gadavist discarded. As per [HOSPITAL] guidelines 3D reconstructions are performed with concurrent physician 
supervision. Patient was scanned on a 3T magnet.

[Series 101: survey-mst · axial · 10.0mm · 1.34mm/px · 1 of 14 slices shown]
[im 1/14]
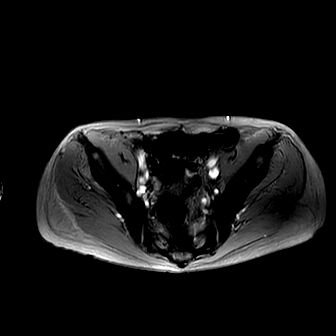

[Series 201: t1w_tse_ax · axial · 6.0mm · 0.37mm/px · 1 of 36 slices shown]
[im 1/36]
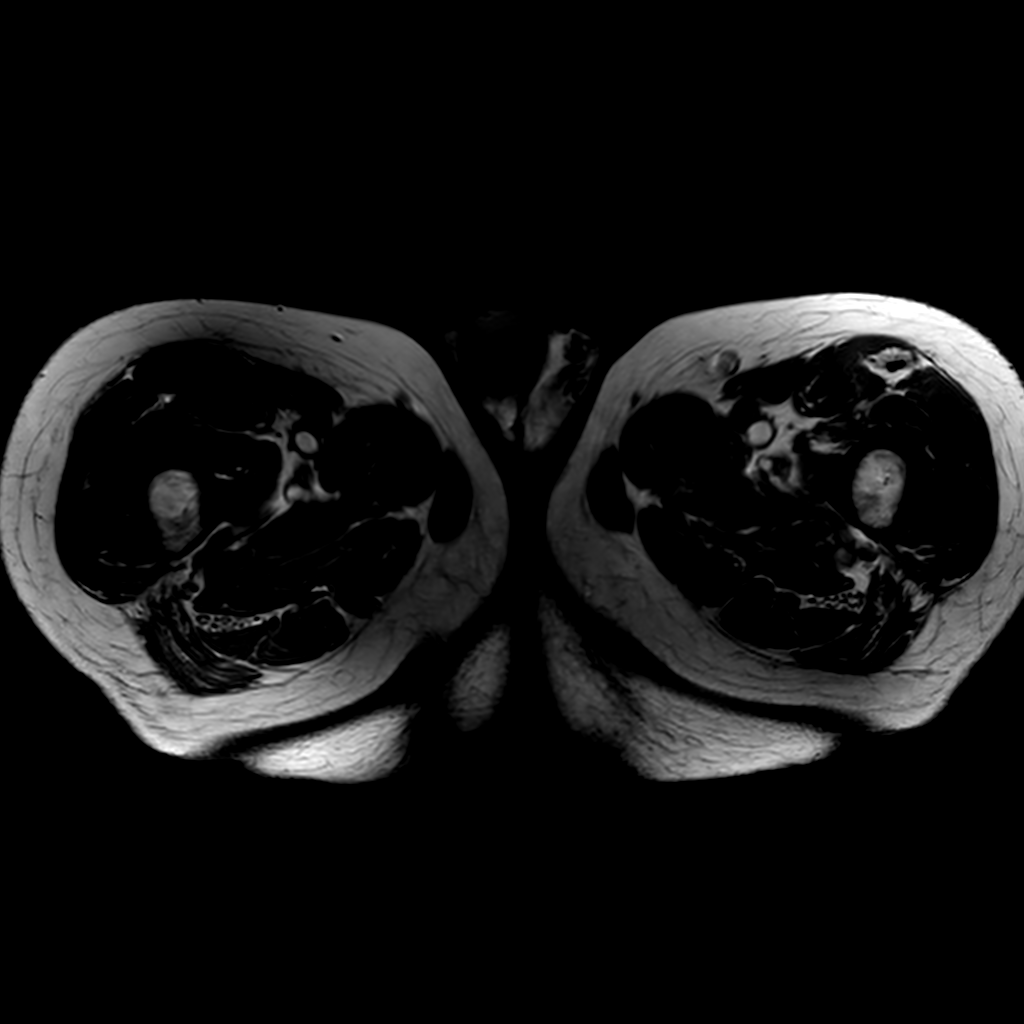

[Series 301: t2w sag · sagittal · 3.0mm · 0.42mm/px · 1 of 30 slices shown]
[im 1/30]
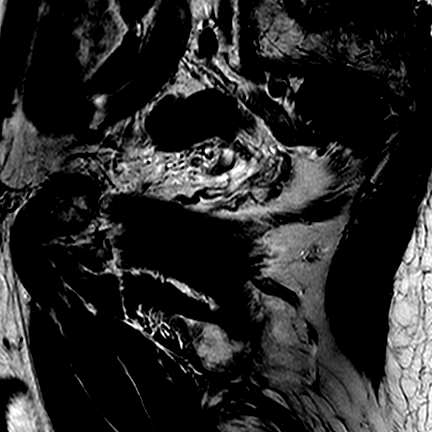

[Series 401: t2w cor · coronal · 3.0mm · 0.42mm/px · 1 of 30 slices shown]
[im 1/30]
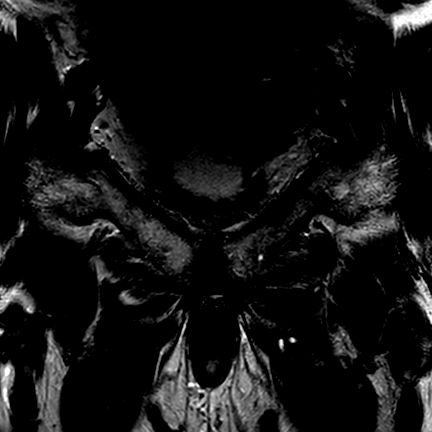

[Series 601: new-dwi_3b* 3mm* · axial · 3.0mm · 1.28mm/px · 1 of 57 slices shown]
[im 1/57]
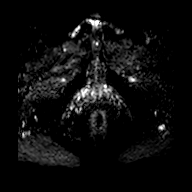

[Series 602: ADC · axial · 3.0mm · 1.28mm/px · 1 of 31 slices shown (1 of 2)]
[im 1/31]
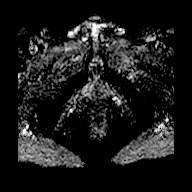

[Series 603: ADC · axial · 3.0mm · 1.28mm/px · 1 of 32 slices shown (2 of 2)]
[im 1/32]
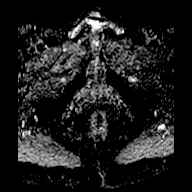

[Series 604: (id) · axial · 3.0mm · 1.28mm/px · 1 of 31 slices shown (1 of 3)]
[im 1/31]
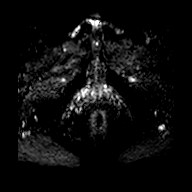

[Series 605: (id) · axial · 3.0mm · 1.28mm/px · 1 of 26 slices shown (2 of 3)]
[im 1/26]
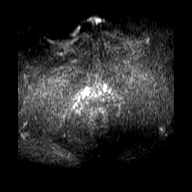

[Series 701: (id) · axial · 3.0mm · 1.28mm/px · 1 of 32 slices shown (3 of 3)]
[im 1/32]
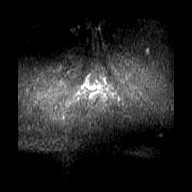

[Series 801: t2w ax · axial · 3.0mm · 0.38mm/px · 1 of 32 slices shown]
[im 1/32]
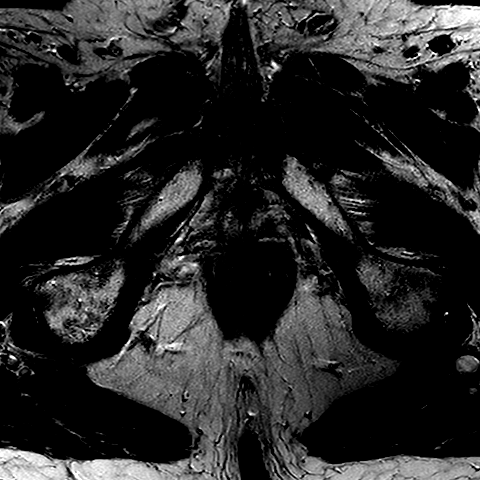

[Series 1001: dyn 3mm*(ap) · axial · 3.0mm · 1.38mm/px · z∈[-122,-29]mm · 36 of 1440 slices shown]
[im 1/1440]
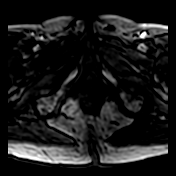
[im 42/1440]
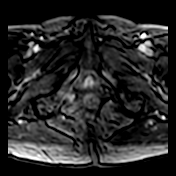
[im 83/1440]
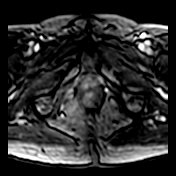
[im 124/1440]
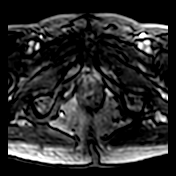
[im 165/1440]
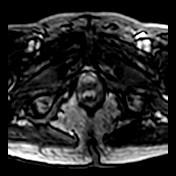
[im 206/1440]
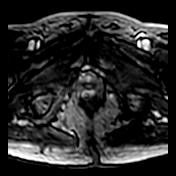
[im 247/1440]
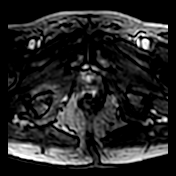
[im 288/1440]
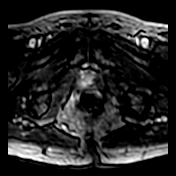
[im 329/1440]
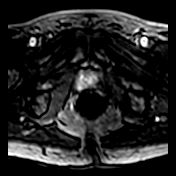
[im 371/1440]
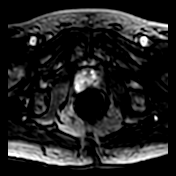
[im 412/1440]
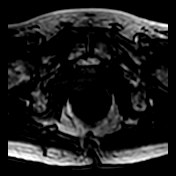
[im 453/1440]
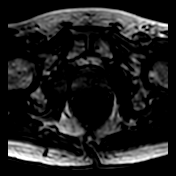
[im 494/1440]
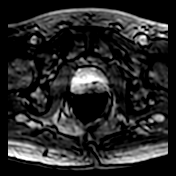
[im 535/1440]
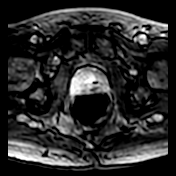
[im 576/1440]
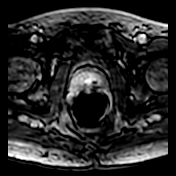
[im 617/1440]
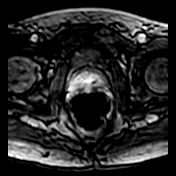
[im 658/1440]
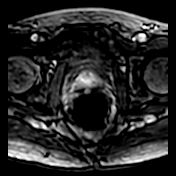
[im 699/1440]
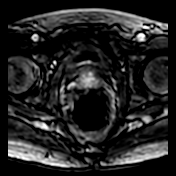
[im 741/1440]
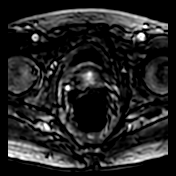
[im 782/1440]
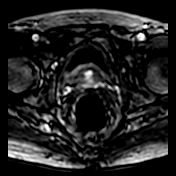
[im 823/1440]
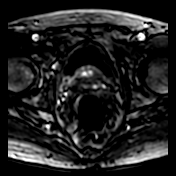
[im 864/1440]
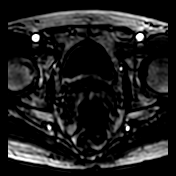
[im 905/1440]
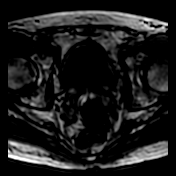
[im 946/1440]
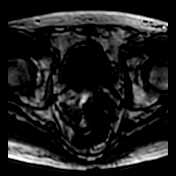
[im 987/1440]
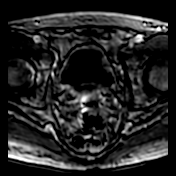
[im 1028/1440]
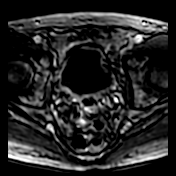
[im 1069/1440]
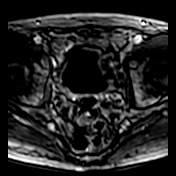
[im 1111/1440]
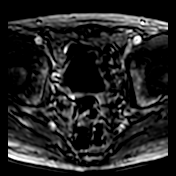
[im 1152/1440]
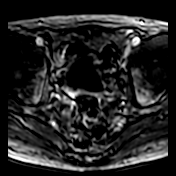
[im 1193/1440]
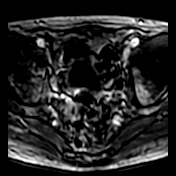
[im 1234/1440]
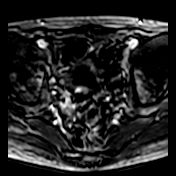
[im 1275/1440]
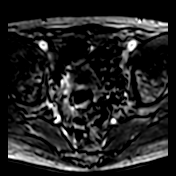
[im 1316/1440]
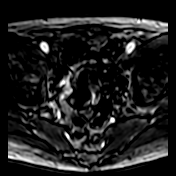
[im 1357/1440]
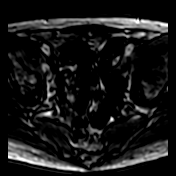
[im 1398/1440]
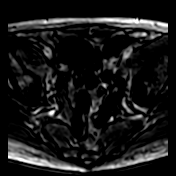
[im 1440/1440]
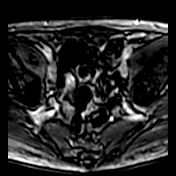

[Series 1102: DIXON · axial · 6.0mm · 0.49mm/px · 1 of 36 slices shown]
[im 1/36]
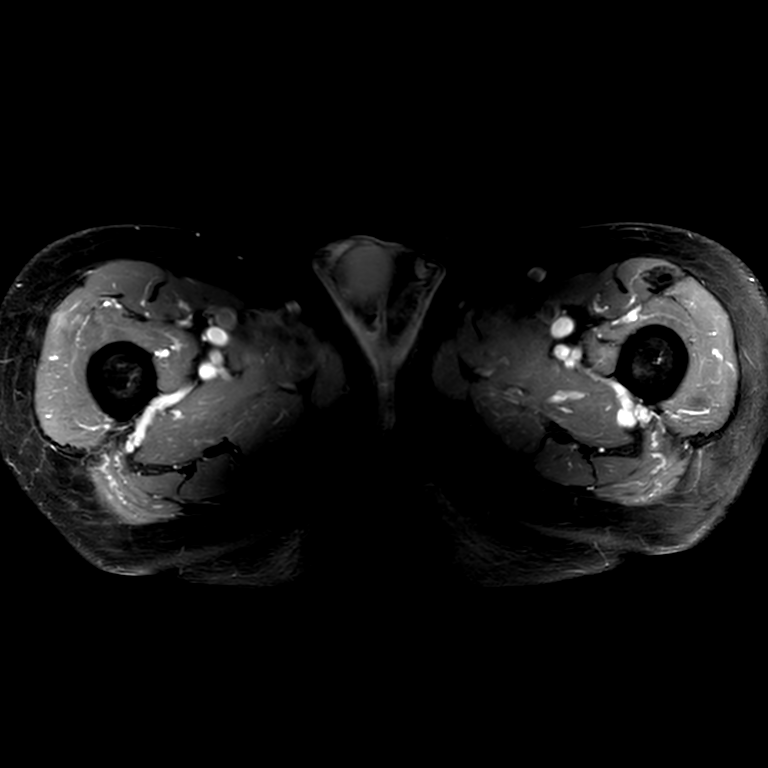

[48 of 48 positions shown; findings below may reference images not displayed]

FINDINGS: VOLUME: 30 cc 
CENTRAL GLAND: Nodular in appearance. Mildly elevates the bladder base. 
PERIPHERAL ZONE: Again note is made of confluent decreased T2 signal throughout 
the right peripheral zone from the apex all the way through the base. On T2 
axial image 12 measures upwards of 2.5 cm. Shows at least mild restricted 
diffusion compared to the left peripheral zone. Shows asymmetric flow and is 
concerning for malignancy. 
PROSTATE CAPSULE: The capsule is poorly defined on the right. Cannot exclude 
extracapsular extension. 
MUSCLE SIDE WALLS: Normal in appearance. 
SEMINAL VESICLES: Normal in appearance. 
BLADDER: Mildly thickened and trabeculated. 
LYMPHADENOPATHY: No suspicious adenopathy identified. 
BONES: No osseous lesion seen. 
ADDITIONAL FINDINGS: Mild degenerative changes.
IMPRESSION: Findings concerning for malignancy involving the entire right peripheral zone. 
Cannot exclude extracapsular extension. 
(PI-RADS 5): Very high (clinically significant cancer is highly likely to be 
present).

## 2024-04-29 ENCOUNTER — Other Ambulatory Visit
Admission: RE | Admit: 2024-04-29 | Discharge: 2024-04-29 | Disposition: A | Discharge status: Home / Self Care | Source: Ambulatory Visit

## 2024-04-29 DIAGNOSIS — D6869 Other thrombophilia: Secondary | ICD-10-CM | POA: Insufficient documentation

## 2024-04-29 LAB — PROTIME-INR
INR: 2.3 — ABNORMAL HIGH (ref 0.9–1.1)
Protime: 26.4 s — ABNORMAL HIGH (ref 10.0–12.9)

## 2024-05-30 ENCOUNTER — Other Ambulatory Visit
Admission: RE | Admit: 2024-05-30 | Discharge: 2024-05-30 | Disposition: A | Discharge status: Home / Self Care | Source: Ambulatory Visit

## 2024-05-30 DIAGNOSIS — D6869 Other thrombophilia: Secondary | ICD-10-CM | POA: Insufficient documentation

## 2024-05-30 LAB — PROTIME-INR
INR: 2.1 — ABNORMAL HIGH (ref 0.9–1.1)
Protime: 23.8 s — ABNORMAL HIGH (ref 10.0–12.9)

## 2024-06-14 ENCOUNTER — Other Ambulatory Visit: Payer: Self-pay

## 2024-06-14 ENCOUNTER — Ambulatory Visit: Admitting: Urgent Care

## 2024-06-14 VITALS — BP 128/73 | HR 82 | Temp 96.8°F | Resp 16

## 2024-06-14 DIAGNOSIS — T63441A Toxic effect of venom of bees, accidental (unintentional), initial encounter: Secondary | ICD-10-CM

## 2024-06-14 DIAGNOSIS — R2232 Localized swelling, mass and lump, left upper limb: Secondary | ICD-10-CM

## 2024-06-14 MED ORDER — TRIAMCINOLONE ACETONIDE 0.5 % EX CREA *I*
TOPICAL_CREAM | Freq: Two times a day (BID) | CUTANEOUS | 0 refills | Status: AC | PRN
Start: 2024-06-14 — End: ?

## 2024-06-14 NOTE — Patient Instructions (Signed)
 You appear to be having a local reaction to a bee sting from 2 days ago.  There is no suggestion of skin infection at this time    Recommend aggressive use of over-the-counter nonsedating antihistamine such as Claritin/loratadine and you may take 2-3 tablets once a day.    You may use Benadryl if needed beyond the Claritin but that might be best at night due to the side effects of drowsiness and risk of fall    May use the prescribed steroid cream couple times a day to help limit itch and inflammation at the site    Use cool compresses multiple times a day and whenever at rest keep the hand and forearm elevated to limit swelling    Watch for signs of infection in the upcoming days and seek care if noted.  Those include increasing/spreading redness, swelling, tenderness, and warmth

## 2024-06-14 NOTE — [facility] Provider Note (Signed)
 History     Chief Complaint   Patient presents with    Insect Bite     Stated 2 days having a bee sting to left wrist. Stated it is itchy and swellig/painful. Denies allergy. Benadryl taken yesterday and today with no relief.      89 year old male presents with ongoing itch and swelling at the site of a bee sting from 2 days ago.  Has been applying cool compresses.  Describes taking 2 Benadryl yesterday and has used 1 today in effort to control itch.  Denies other significant problems.  He is right-hand dominant.  Has not been wearing his watch on left wrist since the bee sting.      History provided by:  Patient  Language interpreter used: No        Medical/Surgical/Family History     Past Medical History[1]     There is no problem list on file for this patient.           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      Physical Exam  Vitals and nursing note reviewed.   Constitutional:       General: He is not in acute distress.     Appearance: Normal appearance. He is not ill-appearing or toxic-appearing.   Eyes:      General: No scleral icterus.  Cardiovascular:      Rate and Rhythm: Normal rate.   Pulmonary:      Effort: Pulmonary effort is normal.   Musculoskeletal:         General: Swelling present.        Arms:    Skin:     Capillary Refill: Capillary refill takes less than 2 seconds.      Coloration: Skin is not jaundiced or pale.      Findings: Erythema present.   Neurological:      Mental Status: He is alert and oriented to person, place, and time.      Motor: No weakness.      Coordination:  Coordination normal.      Gait: Gait normal.   Psychiatric:         Mood and Affect: Mood normal.         Behavior: Behavior normal.         Thought Content: Thought content normal.          Medical Decision Making   Medical Decision Making  Assessment:    Local reaction to bee sting on the left wrist; exam is not suggesting active cellulitis at this time    Differential diagnosis:    Bee sting local reaction  Cellulitis  Contact dermatitis    Plan and Results:    Prescription triamcinolone   Advised aggressive use of nonsedating antihistamines and as needed use of Benadryl  due to the potential side effects  Elevation and cool compresses advised  Reviewed signs of infection for which she should follow-up for further evaluation        Encounter orders    Orders Placed This Encounter      triamcinolone  (KENALOG ) 0.5 % cream        Diagnosis and Disposition:       Return precautions discussed and provided on AVS.    Discharge Medications  Start Taking             triamcinolone  (KENALOG ) 0.5 % cream Apply topically 2 times daily as   needed. To affected area about left wrist/forearm            Follow-up  Follow up if symptoms worsen or fail to improve.            Final Diagnosis    ICD-10-CM ICD-9-CM   1. Local reaction to bee sting, accidental or unintentional, initial encounter  T63.441A 989.5     E905.3         EVALENE MAYANS, MD          Author:  EVALENE MAYANS, MD                 [1]   Past Medical History:  Diagnosis Date    C. difficile colitis 2013    DVT (deep venous thrombosis)     Factor V Leiden    [2]   Past Surgical History:  Procedure Laterality Date    KNEE SURGERY     [3] No family history on file.  [4]   Social History  Tobacco Use    Smoking status: Never

## 2024-07-04 ENCOUNTER — Other Ambulatory Visit
Admission: RE | Admit: 2024-07-04 | Discharge: 2024-07-04 | Disposition: A | Discharge status: Home / Self Care | Source: Ambulatory Visit

## 2024-07-04 DIAGNOSIS — D6869 Other thrombophilia: Secondary | ICD-10-CM | POA: Insufficient documentation

## 2024-07-04 LAB — PROTIME-INR
INR: 2.1 — ABNORMAL HIGH (ref 0.9–1.1)
Protime: 24.1 s — ABNORMAL HIGH (ref 10.0–12.9)

## 2024-08-08 ENCOUNTER — Other Ambulatory Visit
Admission: RE | Admit: 2024-08-08 | Discharge: 2024-08-08 | Disposition: A | Discharge status: Home / Self Care | Source: Ambulatory Visit

## 2024-08-08 DIAGNOSIS — D6869 Other thrombophilia: Secondary | ICD-10-CM | POA: Insufficient documentation

## 2024-08-08 LAB — PROTIME-INR
INR: 2.1 — ABNORMAL HIGH (ref 0.9–1.1)
Protime: 23.4 s — ABNORMAL HIGH (ref 10.0–12.9)

## 4866-02-15 DEATH — deceased
